# Patient Record
Sex: Female | Born: 1990 | Race: Black or African American | Hispanic: No | Marital: Single | Smoking: Current some day smoker
Health system: Southern US, Community
[De-identification: ages and names within clinical notes are randomized; demographics above are authoritative.]

## PROBLEM LIST (undated history)

## (undated) ENCOUNTER — Emergency Department (HOSPITAL_COMMUNITY): Admission: EM | Payer: BLUE CROSS/BLUE SHIELD | Source: Home / Self Care

## (undated) DIAGNOSIS — G43909 Migraine, unspecified, not intractable, without status migrainosus: Secondary | ICD-10-CM

## (undated) DIAGNOSIS — M549 Dorsalgia, unspecified: Secondary | ICD-10-CM

## (undated) HISTORY — DX: Migraine, unspecified, not intractable, without status migrainosus: G43.909

## (undated) HISTORY — PX: TONSILLECTOMY: SUR1361

---

## 2011-08-26 ENCOUNTER — Encounter (HOSPITAL_COMMUNITY): Payer: Self-pay

## 2011-08-26 ENCOUNTER — Emergency Department (INDEPENDENT_AMBULATORY_CARE_PROVIDER_SITE_OTHER)
Admission: EM | Admit: 2011-08-26 | Discharge: 2011-08-26 | Disposition: A | Discharge status: Home / Self Care | Payer: BC Managed Care – PPO | Source: Home / Self Care | Attending: Emergency Medicine | Admitting: Emergency Medicine

## 2011-08-26 DIAGNOSIS — M549 Dorsalgia, unspecified: Secondary | ICD-10-CM

## 2011-08-26 DIAGNOSIS — N390 Urinary tract infection, site not specified: Secondary | ICD-10-CM

## 2011-08-26 HISTORY — DX: Dorsalgia, unspecified: M54.9

## 2011-08-26 LAB — POCT URINALYSIS DIP (DEVICE)
Glucose, UA: NEGATIVE mg/dL
Ketones, ur: NEGATIVE mg/dL
Specific Gravity, Urine: 1.025 (ref 1.005–1.030)

## 2011-08-26 LAB — POCT PREGNANCY, URINE: Preg Test, Ur: NEGATIVE

## 2011-08-26 MED ORDER — SULFAMETHOXAZOLE-TRIMETHOPRIM 800-160 MG PO TABS: 1.0000 | ORAL_TABLET | Freq: Two times a day (BID) | ORAL | Status: AC

## 2011-08-26 MED ORDER — PREDNISONE 20 MG PO TABS: ORAL_TABLET | ORAL | Status: AC

## 2011-08-26 MED ORDER — HYDROCODONE-ACETAMINOPHEN 5-325 MG PO TABS: 2.0000 | ORAL_TABLET | ORAL | Status: AC | PRN

## 2011-08-26 MED ORDER — IBUPROFEN 600 MG PO TABS: 600.0000 mg | ORAL_TABLET | Freq: Four times a day (QID) | ORAL | Status: AC | PRN
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
## 2011-08-26 NOTE — ED Notes (Signed)
C/o pain in lower bilateral mid back, sometimes worse w movement, worse w CVJ percussion , denies UA syx, no blood in urine

## 2012-05-13 ENCOUNTER — Emergency Department (HOSPITAL_COMMUNITY)
Admission: EM | Admit: 2012-05-13 | Discharge: 2012-05-13 | Disposition: A | Discharge status: Home / Self Care | Payer: BC Managed Care – PPO | Attending: Emergency Medicine | Admitting: Emergency Medicine

## 2012-05-13 ENCOUNTER — Encounter (HOSPITAL_COMMUNITY): Payer: Self-pay | Admitting: Emergency Medicine

## 2012-05-13 DIAGNOSIS — Z87828 Personal history of other (healed) physical injury and trauma: Secondary | ICD-10-CM | POA: Insufficient documentation

## 2012-05-13 DIAGNOSIS — R112 Nausea with vomiting, unspecified: Secondary | ICD-10-CM | POA: Insufficient documentation

## 2012-05-13 LAB — PREGNANCY, URINE: Preg Test, Ur: NEGATIVE

## 2012-05-13 MED ORDER — ONDANSETRON 4 MG PO TBDP
8.0000 mg | ORAL_TABLET | Freq: Once | ORAL | Status: AC
  Administered 2012-05-13: 8 mg via ORAL
  Filled 2012-05-13: qty 2

## 2012-05-13 MED ORDER — ONDANSETRON 8 MG PO TBDP: 8.0000 mg | ORAL_TABLET | Freq: Three times a day (TID) | ORAL | Status: DC | PRN
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
## 2012-05-13 NOTE — ED Notes (Signed)
PT. REPORTS EMESIS WITH CHILLS ONSET LAST NIGHT , DENIES FEVER , NO VOMITTING OR DIARRHEA.

## 2014-10-17 ENCOUNTER — Other Ambulatory Visit: Payer: Self-pay | Admitting: Obstetrics & Gynecology

## 2014-10-17 ENCOUNTER — Other Ambulatory Visit (HOSPITAL_COMMUNITY)
Admission: RE | Admit: 2014-10-17 | Discharge: 2014-10-17 | Disposition: A | Discharge status: Home / Self Care | Payer: BLUE CROSS/BLUE SHIELD | Source: Ambulatory Visit | Attending: Obstetrics & Gynecology | Admitting: Obstetrics & Gynecology

## 2014-10-17 DIAGNOSIS — Z01419 Encounter for gynecological examination (general) (routine) without abnormal findings: Secondary | ICD-10-CM | POA: Insufficient documentation

## 2014-10-17 DIAGNOSIS — Z113 Encounter for screening for infections with a predominantly sexual mode of transmission: Secondary | ICD-10-CM | POA: Insufficient documentation

## 2014-10-18 LAB — CYTOLOGY - PAP

## 2015-03-21 ENCOUNTER — Emergency Department (INDEPENDENT_AMBULATORY_CARE_PROVIDER_SITE_OTHER)
Admission: EM | Admit: 2015-03-21 | Discharge: 2015-03-21 | Disposition: A | Discharge status: Home / Self Care | Payer: BLUE CROSS/BLUE SHIELD | Source: Home / Self Care | Attending: Family Medicine | Admitting: Family Medicine

## 2015-03-21 DIAGNOSIS — G43009 Migraine without aura, not intractable, without status migrainosus: Secondary | ICD-10-CM | POA: Diagnosis not present

## 2015-03-21 MED ORDER — SUMATRIPTAN SUCCINATE 100 MG PO TABS: 100.0000 mg | ORAL_TABLET | ORAL | Status: AC | PRN
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
## 2015-03-21 NOTE — Discharge Instructions (Signed)

## 2015-03-21 NOTE — ED Notes (Signed)
C/o headache States she has had a headache since 03/14/15 States does have family hx of migraines States zyrtec and tylenol used as tx

## 2015-03-27 ENCOUNTER — Emergency Department (HOSPITAL_COMMUNITY): Payer: BLUE CROSS/BLUE SHIELD

## 2015-03-27 ENCOUNTER — Emergency Department (HOSPITAL_COMMUNITY)
Admission: EM | Admit: 2015-03-27 | Discharge: 2015-03-28 | Disposition: A | Discharge status: Home / Self Care | Payer: BLUE CROSS/BLUE SHIELD | Attending: Emergency Medicine | Admitting: Emergency Medicine

## 2015-03-27 ENCOUNTER — Encounter (HOSPITAL_COMMUNITY): Payer: Self-pay | Admitting: *Deleted

## 2015-03-27 DIAGNOSIS — H539 Unspecified visual disturbance: Secondary | ICD-10-CM | POA: Diagnosis not present

## 2015-03-27 DIAGNOSIS — R51 Headache: Secondary | ICD-10-CM | POA: Diagnosis present

## 2015-03-27 DIAGNOSIS — H547 Unspecified visual loss: Secondary | ICD-10-CM

## 2015-03-27 LAB — CBC WITH DIFFERENTIAL/PLATELET
Basophils Absolute: 0 10*3/uL (ref 0.0–0.1)
Basophils Relative: 0 % (ref 0–1)
EOS PCT: 1 % (ref 0–5)
Eosinophils Absolute: 0.1 10*3/uL (ref 0.0–0.7)
HEMATOCRIT: 38.5 % (ref 36.0–46.0)
HEMOGLOBIN: 12.6 g/dL (ref 12.0–15.0)
LYMPHS ABS: 3.3 10*3/uL (ref 0.7–4.0)
LYMPHS PCT: 32 % (ref 12–46)
MCH: 29.9 pg (ref 26.0–34.0)
MCHC: 32.7 g/dL (ref 30.0–36.0)
MCV: 91.2 fL (ref 78.0–100.0)
Monocytes Absolute: 0.7 10*3/uL (ref 0.1–1.0)
Monocytes Relative: 6 % (ref 3–12)
NEUTROS ABS: 6.4 10*3/uL (ref 1.7–7.7)
NEUTROS PCT: 61 % (ref 43–77)
Platelets: 225 10*3/uL (ref 150–400)
RBC: 4.22 MIL/uL (ref 3.87–5.11)
RDW: 12.6 % (ref 11.5–15.5)
WBC: 10.4 10*3/uL (ref 4.0–10.5)

## 2015-03-27 LAB — SEDIMENTATION RATE: Sed Rate: 5 mm/hr (ref 0–22)

## 2015-03-27 LAB — BASIC METABOLIC PANEL
ANION GAP: 8 (ref 5–15)
BUN: 11 mg/dL (ref 6–20)
CHLORIDE: 101 mmol/L (ref 101–111)
CO2: 23 mmol/L (ref 22–32)
Calcium: 8.6 mg/dL — ABNORMAL LOW (ref 8.9–10.3)
Creatinine, Ser: 0.72 mg/dL (ref 0.44–1.00)
GFR calc Af Amer: >60 mL/min (ref >60)
GFR calc non Af Amer: >60 mL/min (ref >60)
GLUCOSE: 85 mg/dL (ref 65–99)
POTASSIUM: 4 mmol/L (ref 3.5–5.1)
Sodium: 132 mmol/L — ABNORMAL LOW (ref 135–145)

## 2015-03-27 MED ORDER — DIPHENHYDRAMINE HCL 50 MG/ML IJ SOLN
25.0000 mg | Freq: Once | INTRAMUSCULAR | Status: AC
  Administered 2015-03-27: 25 mg via INTRAVENOUS
  Filled 2015-03-27: qty 1

## 2015-03-27 MED ORDER — METOCLOPRAMIDE HCL 5 MG/ML IJ SOLN
10.0000 mg | Freq: Once | INTRAMUSCULAR | Status: AC
  Administered 2015-03-27: 10 mg via INTRAVENOUS
  Filled 2015-03-27: qty 2

## 2015-03-27 MED ORDER — IOHEXOL 350 MG/ML SOLN
80.0000 mL | Freq: Once | INTRAVENOUS | Status: AC | PRN
  Administered 2015-03-27: 80 mL via INTRAVENOUS

## 2015-03-27 MED ORDER — DEXAMETHASONE SODIUM PHOSPHATE 10 MG/ML IJ SOLN
10.0000 mg | Freq: Once | INTRAMUSCULAR | Status: AC
  Administered 2015-03-27: 10 mg via INTRAVENOUS
  Filled 2015-03-27: qty 1
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
## 2015-03-27 NOTE — ED Notes (Signed)
Pt reports two weeks ago started as headache, then began having pain behind her right eye and was seen at [facility] and told she had a migraine. Now has decreased vision in right eye since Tuesday, pcp sent her here for further eval. No neuro deficits noted at triage.

## 2015-03-28 ENCOUNTER — Other Ambulatory Visit: Payer: BLUE CROSS/BLUE SHIELD

## 2015-03-28 ENCOUNTER — Other Ambulatory Visit (HOSPITAL_COMMUNITY): Payer: Self-pay | Admitting: *Deleted

## 2015-03-28 ENCOUNTER — Encounter: Payer: Self-pay | Admitting: Neurology

## 2015-03-28 ENCOUNTER — Ambulatory Visit (INDEPENDENT_AMBULATORY_CARE_PROVIDER_SITE_OTHER): Payer: BLUE CROSS/BLUE SHIELD | Admitting: Neurology

## 2015-03-28 VITALS — BP 106/68 | HR 96 | Temp 98.2°F | Resp 16 | Ht 64.0 in | Wt 188.4 lb

## 2015-03-28 DIAGNOSIS — H469 Unspecified optic neuritis: Secondary | ICD-10-CM

## 2015-03-28 MED ORDER — SODIUM CHLORIDE 0.9 % IV SOLN: 1000.0000 mg | Freq: Every day | INTRAVENOUS | Status: DC

## 2015-03-28 MED ORDER — TETRACAINE HCL 0.5 % OP SOLN
2.0000 [drp] | Freq: Once | OPHTHALMIC | Status: AC
  Administered 2015-03-28: 2 [drp] via OPHTHALMIC
  Filled 2015-03-28: qty 2
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
## 2015-03-28 NOTE — Patient Instructions (Signed)
1. Bloodwork for ANA, ACE, pregnancy test (beta-HCG) 2. Schedule MRI brain and orbits with and without contrast, MRI cervical and thoracic spine with and without contrast 3. Start 3-day course of IV steroids tomorrow 4. Follow-up after MRI

## 2015-03-28 NOTE — Discharge Instructions (Signed)
PLEASE FOLLOW UP WITH DR. Vonna Kotyk AT 7:45AM TODAY 03/28/15 FOR YOUR FOLLOW UP APPOINTMENT. PLEASE DO NOT SKIP THIS APPOINTMENT. Please read all discharge instructions and return precautions.   Visual Disturbances You have had a disturbance in your vision. This may be caused by various conditions, such as:  Migraines. Migraine headaches are often preceded by a disturbance in vision. Blind spots or light flashes are followed by a headache. This type of visual disturbance is temporary. It does not damage the eye.  Glaucoma. This is caused by increased pressure in the eye. Symptoms include haziness, blurred vision, or seeing rainbow colored circles when looking at bright lights. Partial or complete visual loss can occur. You may or may not experience eye pain. Visual loss may be gradual or sudden and is irreversible. Glaucoma is the leading cause of blindness.  Retina problems. Vision will be reduced if the retina becomes detached or if there is a circulation problem as with diabetes, high blood pressure, or a mini-stroke. Symptoms include seeing "floaters," flashes of light, or shadows, as if a curtain has fallen over your eye.  Optic nerve problems. The main nerve in your eye can be damaged by redness, soreness, and swelling (inflammation), poor circulation, drugs, and toxins. It is very important to have a complete exam done by a specialist to determine the exact cause of your eye problem. The specialist may recommend medicines or surgery, depending on the cause of the problem. This can help prevent further loss of vision or reduce the risk of having a stroke. Contact the caregiver to whom you have been referred and arrange for follow-up care right away. SEEK IMMEDIATE MEDICAL CARE IF:   Your vision gets worse.  You develop severe headaches.  You have any weakness or numbness in the face, arms, or legs.  You have any trouble speaking or walking. Document Released: 08/08/2004 Document Revised:  09/23/2011 Document Reviewed: 11/29/2009 [facility] Patient Information 2015 [place], [place]. This information is not intended to replace advice given to you by your health care provider. Make sure you discuss any questions you have with your health care provider.

## 2015-03-28 NOTE — ED Notes (Signed)
Pt stable, ambulatory, states understanding of discharge instructions 

## 2015-03-29 ENCOUNTER — Encounter (HOSPITAL_COMMUNITY)
Admission: RE | Admit: 2015-03-29 | Discharge: 2015-03-29 | Disposition: A | Discharge status: Home / Self Care | Payer: BLUE CROSS/BLUE SHIELD | Source: Ambulatory Visit | Attending: Neurology | Admitting: Neurology

## 2015-03-29 DIAGNOSIS — H469 Unspecified optic neuritis: Secondary | ICD-10-CM | POA: Diagnosis not present

## 2015-03-29 LAB — ANGIOTENSIN CONVERTING ENZYME: ANGIOTENSIN-CONVERTING ENZYME: 41 U/L (ref 8–52)

## 2015-03-29 LAB — ANCA TITERS: C-ANCA: <1:20 {titer}

## 2015-03-29 LAB — HCG, SERUM, QUALITATIVE: PREG SERUM: NEGATIVE

## 2015-03-29 LAB — ANA: Anti Nuclear Antibody(ANA): NEGATIVE

## 2015-03-29 MED ORDER — SODIUM CHLORIDE 0.9 % IV SOLN
1000.0000 mg | Freq: Every day | INTRAVENOUS | Status: DC
  Administered 2015-03-29: 1000 mg via INTRAVENOUS
  Filled 2015-03-29: qty 8

## 2015-03-30 ENCOUNTER — Encounter (HOSPITAL_COMMUNITY)
Admission: RE | Admit: 2015-03-30 | Discharge: 2015-03-30 | Disposition: A | Discharge status: Home / Self Care | Payer: BLUE CROSS/BLUE SHIELD | Source: Ambulatory Visit | Attending: Neurology | Admitting: Neurology

## 2015-03-30 ENCOUNTER — Telehealth: Payer: Self-pay | Admitting: Family Medicine

## 2015-03-30 DIAGNOSIS — H469 Unspecified optic neuritis: Secondary | ICD-10-CM | POA: Insufficient documentation

## 2015-03-30 MED ORDER — METHYLPREDNISOLONE SODIUM SUCC 1000 MG IJ SOLR
1000.0000 mg | Freq: Every day | INTRAMUSCULAR | Status: DC
  Administered 2015-03-30: 1000 mg via INTRAVENOUS
  Filled 2015-03-30: qty 8

## 2015-03-30 NOTE — Progress Notes (Signed)
pts has requested to keep IV in her left hand since she has to be back tomorrow for another infusion.  IV is flushing well and there was blood return.  IV was saline locked and flushed and wrapped with hypofix

## 2015-03-30 NOTE — Telephone Encounter (Signed)
Patient was notified of results.  

## 2015-03-30 NOTE — Telephone Encounter (Signed)
-----   Message from Van Clines, MD sent at 03/29/2015  2:31 PM EDT ----- Pls let her know bloodwork did not show any inflammation, pregnancy test negative. Thanks

## 2015-03-31 ENCOUNTER — Encounter (HOSPITAL_COMMUNITY)
Admission: RE | Admit: 2015-03-31 | Discharge: 2015-03-31 | Disposition: A | Discharge status: Home / Self Care | Payer: BLUE CROSS/BLUE SHIELD | Source: Ambulatory Visit | Attending: Neurology | Admitting: Neurology

## 2015-03-31 ENCOUNTER — Telehealth: Payer: Self-pay | Admitting: Neurology

## 2015-03-31 DIAGNOSIS — H469 Unspecified optic neuritis: Secondary | ICD-10-CM | POA: Insufficient documentation

## 2015-03-31 MED ORDER — METHYLPREDNISOLONE SODIUM SUCC 1000 MG IJ SOLR
1000.0000 mg | Freq: Every day | INTRAMUSCULAR | Status: DC
  Administered 2015-03-31: 1000 mg via INTRAVENOUS
  Filled 2015-03-31: qty 8

## 2015-03-31 NOTE — Telephone Encounter (Signed)
Pt called requesting an appt for/ IV Steroid Tx//call back @ [phone]

## 2015-03-31 NOTE — Telephone Encounter (Signed)
Lmovm to return my call. 

## 2015-03-31 NOTE — Telephone Encounter (Signed)
I spoke with her. She wanted to ask if you thought she would benefit from 2 more days of IV solu medrol. She states it's something that her mother mentioned to her.

## 2015-03-31 NOTE — Telephone Encounter (Signed)
Pls let her know that the Optic Neuritis Treatment Trial where a large number of patients with optic neuritis were studied, indicated 3 days of IV steroids, this is what our plan is based on. The 5-days is if dose is 500mg , but her dose is higher 1000mg . Pls have her let mother know this information. Thanks.

## 2015-03-31 NOTE — Telephone Encounter (Signed)
Called patient and notified of below information.

## 2015-04-04 ENCOUNTER — Ambulatory Visit (HOSPITAL_COMMUNITY): Admission: RE | Admit: 2015-04-04 | Payer: BLUE CROSS/BLUE SHIELD | Source: Ambulatory Visit

## 2015-04-04 ENCOUNTER — Other Ambulatory Visit: Payer: Self-pay | Admitting: Neurology

## 2015-04-04 ENCOUNTER — Ambulatory Visit (HOSPITAL_COMMUNITY)
Admission: RE | Admit: 2015-04-04 | Discharge: 2015-04-04 | Disposition: A | Discharge status: Home / Self Care | Payer: BLUE CROSS/BLUE SHIELD | Source: Ambulatory Visit | Attending: Neurology | Admitting: Neurology

## 2015-04-04 DIAGNOSIS — G35 Multiple sclerosis: Secondary | ICD-10-CM | POA: Diagnosis not present

## 2015-04-04 DIAGNOSIS — H469 Unspecified optic neuritis: Secondary | ICD-10-CM

## 2015-04-04 DIAGNOSIS — G379 Demyelinating disease of central nervous system, unspecified: Secondary | ICD-10-CM | POA: Diagnosis not present

## 2015-04-04 MED ORDER — GADOBENATE DIMEGLUMINE 529 MG/ML IV SOLN
18.0000 mL | Freq: Once | INTRAVENOUS | Status: AC | PRN
  Administered 2015-04-04: 18 mL via INTRAVENOUS

## 2015-04-05 ENCOUNTER — Telehealth: Payer: Self-pay | Admitting: *Deleted

## 2015-04-05 NOTE — Telephone Encounter (Signed)
Called patient and asked her to call me back about setting up an appointment for Friday or Tuesday.

## 2015-04-05 NOTE — Telephone Encounter (Signed)
Patient called back and is not sure which day she can come in.  She will call back to let us know.

## 2015-04-05 NOTE — Telephone Encounter (Signed)
-----   Message from Van Clines, MD sent at 04/05/2015  2:14 PM EDT ----- Regarding: clinic follow-up Can you pls ask patient when cervical and thoracic spine is scheduled? Would like to discuss results with her, I have openings on Friday and Tues, thanks

## 2015-04-05 NOTE — Telephone Encounter (Signed)
-----   Message from Karen M Aquino, MD sent at 04/05/2015  2:14 PM EDT ----- Regarding: clinic follow-up Can you pls ask patient when cervical and thoracic spine is scheduled? Would like to discuss results with her, I have openings on Friday and Tues, thanks 

## 2015-04-06 ENCOUNTER — Ambulatory Visit (HOSPITAL_COMMUNITY): Payer: BLUE CROSS/BLUE SHIELD

## 2015-04-06 ENCOUNTER — Ambulatory Visit (HOSPITAL_COMMUNITY)
Admission: RE | Admit: 2015-04-06 | Discharge: 2015-04-06 | Disposition: A | Discharge status: Home / Self Care | Payer: BLUE CROSS/BLUE SHIELD | Source: Ambulatory Visit | Attending: Neurology | Admitting: Neurology

## 2015-04-06 DIAGNOSIS — H469 Unspecified optic neuritis: Secondary | ICD-10-CM | POA: Diagnosis not present

## 2015-04-06 MED ORDER — GADOBENATE DIMEGLUMINE 529 MG/ML IV SOLN
20.0000 mL | Freq: Once | INTRAVENOUS | Status: AC | PRN
  Administered 2015-04-06: 18 mL via INTRAVENOUS

## 2015-04-07 ENCOUNTER — Encounter: Payer: Self-pay | Admitting: Neurology

## 2015-04-07 ENCOUNTER — Ambulatory Visit (INDEPENDENT_AMBULATORY_CARE_PROVIDER_SITE_OTHER): Payer: BLUE CROSS/BLUE SHIELD | Admitting: Neurology

## 2015-04-07 VITALS — BP 110/70 | HR 82 | Resp 16 | Ht 64.0 in | Wt 189.0 lb

## 2015-04-07 DIAGNOSIS — H469 Unspecified optic neuritis: Secondary | ICD-10-CM

## 2015-04-07 DIAGNOSIS — G35 Multiple sclerosis: Secondary | ICD-10-CM | POA: Diagnosis not present

## 2015-04-07 NOTE — Patient Instructions (Signed)
1. Schedule lumbar puncture under fluoroscopy 2. Read about the medications, let me know your preference (Gilenya or Tecfidera) 3. Call our office for any questions 4. Follow-up in 1-2 weeks (after spinal tap)
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
## 2015-04-10 ENCOUNTER — Encounter: Payer: Self-pay | Admitting: Neurology

## 2015-04-10 ENCOUNTER — Other Ambulatory Visit: Payer: Self-pay | Admitting: Family Medicine

## 2015-04-10 DIAGNOSIS — G35 Multiple sclerosis: Secondary | ICD-10-CM

## 2015-04-10 DIAGNOSIS — H469 Unspecified optic neuritis: Secondary | ICD-10-CM

## 2015-04-14 ENCOUNTER — Other Ambulatory Visit: Payer: Self-pay | Admitting: Neurology

## 2015-04-14 ENCOUNTER — Ambulatory Visit
Admission: RE | Admit: 2015-04-14 | Discharge: 2015-04-14 | Disposition: A | Discharge status: Home / Self Care | Payer: BLUE CROSS/BLUE SHIELD | Source: Ambulatory Visit | Attending: Neurology | Admitting: Neurology

## 2015-04-14 DIAGNOSIS — G35 Multiple sclerosis: Secondary | ICD-10-CM

## 2015-04-14 DIAGNOSIS — H469 Unspecified optic neuritis: Secondary | ICD-10-CM

## 2015-04-14 LAB — CSF CELL COUNT WITH DIFFERENTIAL
EOS CSF: 0 % (ref 0–1)
LYMPHS CSF: 92 % — AB (ref 40–80)
MONOCYTE/MACROPHAGE: 8 % — AB (ref 15–45)
RBC Count, CSF: 0 cu mm
SEGMENTED NEUTROPHILS-CSF: 0 % (ref 0–6)
Tube #: 3
WBC CSF: 20 uL — AB (ref 0–5)

## 2015-04-14 LAB — GLUCOSE, CSF: Glucose, CSF: 54 mg/dL (ref 43–76)

## 2015-04-14 LAB — PROTEIN, CSF: Total Protein, CSF: 42 mg/dL (ref 15–45)

## 2015-04-14 NOTE — Discharge Instructions (Signed)

## 2015-04-14 NOTE — Progress Notes (Signed)
2 SST tubes drawn from right AC. Site is unremarkable and pt tolerated procedure well.  Discharge instructions explained to pt.

## 2015-04-18 LAB — CSF CULTURE W GRAM STAIN
Gram Stain: NONE SEEN
Organism ID, Bacteria: NO GROWTH

## 2015-04-18 LAB — CRYPTOCOCCAL AG, LTX SCR RFLX TITER: Cryptococcal Ag Screen: NOT DETECTED

## 2015-04-18 LAB — ANGIOTENSIN CONVERTING ENZYME, CSF: ACE, CSF: 7 U/L (ref <=15)

## 2015-04-19 ENCOUNTER — Encounter: Payer: Self-pay | Admitting: Neurology

## 2015-04-19 ENCOUNTER — Ambulatory Visit (INDEPENDENT_AMBULATORY_CARE_PROVIDER_SITE_OTHER): Payer: BLUE CROSS/BLUE SHIELD | Admitting: Neurology

## 2015-04-19 ENCOUNTER — Telehealth: Payer: Self-pay | Admitting: Neurology

## 2015-04-19 VITALS — BP 118/70 | HR 96 | Resp 16 | Ht 64.0 in | Wt 188.0 lb

## 2015-04-19 DIAGNOSIS — G35 Multiple sclerosis: Secondary | ICD-10-CM | POA: Diagnosis not present

## 2015-04-19 DIAGNOSIS — M545 Low back pain: Secondary | ICD-10-CM | POA: Diagnosis not present

## 2015-04-19 DIAGNOSIS — H469 Unspecified optic neuritis: Secondary | ICD-10-CM | POA: Diagnosis not present

## 2015-04-19 LAB — NEUROMYELITIS OPTICA AQP4 AUTO AB

## 2015-04-19 MED ORDER — METAXALONE 400 MG PO TABS: ORAL_TABLET | ORAL | Status: AC

## 2015-04-19 MED ORDER — NAPROXEN 500 MG PO TABS: ORAL_TABLET | ORAL | Status: AC

## 2015-04-19 NOTE — Telephone Encounter (Signed)
Robin from [facility] returned a call/ stated Test Code [place] 35080/ for pt/ Heidi Jimenez//call back @ [phone] for more info

## 2015-04-19 NOTE — Patient Instructions (Addendum)
1. Bloodwork for CBC with differential, CMP, vitamin D level, JC virus DNA PCR (whole blood), HIV 2. We will start the process of getting Tecfidera approved 3. Follow-up with eye doctor as scheduled 4. Follow-up in 3 months, call our office for any changes in symptoms
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
## 2015-04-20 LAB — MYELIN BASIC PROTEIN, CSF: Myelin Basic Protein: <2 mcg/L (ref 0.0–4.0)

## 2015-04-20 LAB — OLIGOCLONAL BANDS, CSF + SERM

## 2015-04-26 ENCOUNTER — Ambulatory Visit: Payer: BLUE CROSS/BLUE SHIELD | Admitting: Neurology

## 2015-05-05 ENCOUNTER — Telehealth: Payer: Self-pay | Admitting: Family Medicine

## 2015-05-05 NOTE — Telephone Encounter (Signed)
Called patient to check with her about finalizing her Rx for Tecfidera. Prior Auth was sent to her insurance co this week for approval. I received faxed approval from [facility] on yesterday. I called [employer] this morning to see what the next step was after approval. They were going to call the patient to get her set up with [facility] to process Rx and to get financial information from her.     When I spoke with her this afternoon she says that she did speak with the rep and everything has been processed with the specialty pharmacy and she is supposed to receive the medication tomorrow.

## 2015-05-10 ENCOUNTER — Emergency Department (HOSPITAL_COMMUNITY)
Admission: EM | Admit: 2015-05-10 | Discharge: 2015-05-11 | Disposition: A | Discharge status: Home / Self Care | Payer: BLUE CROSS/BLUE SHIELD | Attending: Emergency Medicine | Admitting: Emergency Medicine

## 2015-05-10 ENCOUNTER — Encounter (HOSPITAL_COMMUNITY): Payer: Self-pay | Admitting: Emergency Medicine

## 2015-05-10 DIAGNOSIS — S60042A Contusion of left ring finger without damage to nail, initial encounter: Secondary | ICD-10-CM | POA: Diagnosis not present

## 2015-05-10 DIAGNOSIS — Y9289 Other specified places as the place of occurrence of the external cause: Secondary | ICD-10-CM | POA: Diagnosis not present

## 2015-05-10 DIAGNOSIS — Y9367 Activity, basketball: Secondary | ICD-10-CM | POA: Insufficient documentation

## 2015-05-10 DIAGNOSIS — Z720 Tobacco use: Secondary | ICD-10-CM | POA: Insufficient documentation

## 2015-05-10 DIAGNOSIS — G43909 Migraine, unspecified, not intractable, without status migrainosus: Secondary | ICD-10-CM | POA: Diagnosis not present

## 2015-05-10 DIAGNOSIS — W500XXA Accidental hit or strike by another person, initial encounter: Secondary | ICD-10-CM | POA: Diagnosis not present

## 2015-05-10 DIAGNOSIS — S6992XA Unspecified injury of left wrist, hand and finger(s), initial encounter: Secondary | ICD-10-CM

## 2015-05-10 DIAGNOSIS — Y998 Other external cause status: Secondary | ICD-10-CM | POA: Insufficient documentation

## 2015-05-10 DIAGNOSIS — S60032A Contusion of left middle finger without damage to nail, initial encounter: Secondary | ICD-10-CM | POA: Insufficient documentation

## 2015-05-10 NOTE — ED Notes (Signed)
Pt states she was playing basketball and fell on her left hand  Pt has bruising and swelling noted to the palm area of her hand and states she cannot move her 4th ring finger as much as the others
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
## 2015-05-11 ENCOUNTER — Emergency Department (HOSPITAL_COMMUNITY): Payer: BLUE CROSS/BLUE SHIELD

## 2015-05-11 MED ORDER — MELOXICAM 15 MG PO TABS: 15.0000 mg | ORAL_TABLET | Freq: Every day | ORAL | Status: AC

## 2015-05-11 NOTE — Discharge Instructions (Signed)
Take Mobic as needed for pain. Refer to attached documents for more information.

## 2015-05-16 ENCOUNTER — Telehealth: Payer: Self-pay | Admitting: Neurology

## 2015-05-16 ENCOUNTER — Telehealth: Payer: Self-pay | Admitting: Family Medicine

## 2015-05-16 MED ORDER — VITAMIN D (ERGOCALCIFEROL) 1.25 MG (50000 UNIT) PO CAPS: ORAL_CAPSULE | ORAL | Status: AC

## 2015-05-16 NOTE — Telephone Encounter (Signed)
-----   Message from Van Clines, MD sent at 05/16/2015  8:54 AM EDT ----- Regarding: bloodwork Pls let her know some of bloodwork took time to come back, but all looks good except her vitamin D level is low. Would start vitamin D 50,000 IU once a week for 8 weeks, then after start daily calcium 500mg  and vitamin D 600 IU once a day. Thanks

## 2015-05-16 NOTE — Telephone Encounter (Signed)
Labwork from [facility]:  CBC: WBC 9.5 Hgb 12.9 Hct 39.8 Plt 268 Neutrophils 68 Lymphs 25 Monocytes 6 Eos 1 Neutrophils (Absolute) 6.3 Lymphs (Absolute) 2.4  CMP normal  Vitamin D 12.8 (low)  HIV nonreactive  JC virus negative

## 2015-05-16 NOTE — Telephone Encounter (Signed)
Patient was notified of results and advisement on Vitamin D. Rx for Vitamin D 50,000 IU will be sent to patient's pharmacy. She will start otc vitamin D & calcium once she is finished with Rx.

## 2015-06-21 ENCOUNTER — Telehealth: Payer: Self-pay | Admitting: Neurology

## 2015-06-21 NOTE — Telephone Encounter (Signed)
PT called and said she just found out she was pregnant and was asking about taking her medication Tecfidera/Dawn CB# [phone]

## 2015-06-21 NOTE — Telephone Encounter (Signed)
Please advise 

## 2015-06-23 NOTE — Telephone Encounter (Signed)
Instructed patient to stop Tecfidera. There is lower relapse rate during pregnancy, however increases again the first 3 months post-partum, so continue to monitor for now. She knows to call for any change in symptoms. She feels her vision is getting better and better, mostly she has red color desaturation, no eye pain.

## 2015-07-25 ENCOUNTER — Ambulatory Visit: Payer: BLUE CROSS/BLUE SHIELD | Admitting: Neurology

## 2015-12-07 ENCOUNTER — Telehealth: Payer: Self-pay | Admitting: Neurology

## 2015-12-07 NOTE — Telephone Encounter (Signed)
Patient was on Tecfidera. Wants to know when she can start back on med & if once she starts back on med will if be safe for her to breast feed?

## 2015-12-07 NOTE — Telephone Encounter (Signed)
Recommend staying off the medication while pregnancy and while breastfeeding.  Unfortunately, no studies have been done to determine the safety of Tecfidera while breastfeeding and whether it passes through breast milk.

## 2015-12-07 NOTE — Telephone Encounter (Signed)
19-Oct-1990 Heidi Jimenez. She is a patient of Dr. Karel Jarvis. She has MS and she has stopped her MS medication when she became pregnant. She is about 8 months now. She was wondering about when she could start it back. She also had a question about breast feeding. Her number is [phone]. Thank you

## 2015-12-07 NOTE — Telephone Encounter (Signed)
Left msg to rtn my call. 

## 2015-12-08 NOTE — Telephone Encounter (Signed)
Patient returned my call. Notified her of advisement. She is going to make an appt to comeback in and see Dr. Karel Jarvis once she has the baby to discuss what if any other treatment alternatives she has while she will be breastfeeding.

## 2016-02-13 ENCOUNTER — Inpatient Hospital Stay (HOSPITAL_COMMUNITY)
Admission: AD | Admit: 2016-02-13 | Payer: BLUE CROSS/BLUE SHIELD | Source: Ambulatory Visit | Admitting: Obstetrics & Gynecology

## 2016-06-14 IMAGING — MR MR THORACIC SPINE WO/W CM
6 of 15 series · 19 of 48 positions shown · IV contrast (multihance)
Comparison: None.

CLINICAL DATA: Optic neuritis.

EXAM:
MRI CERVICAL AND THORACIC SPINE WITHOUT AND WITH CONTRAST
TECHNIQUE: Multiplanar and multiecho pulse sequences of the cervical spine, to
include the craniocervical junction and cervicothoracic junction,
and thoracic spine, were obtained without and with intravenous
contrast.
CONTRAST:  18mL MULTIHANCE GADOBENATE DIMEGLUMINE 529 MG/ML IV SOLN

[Series 2: T1 · sagittal · 3.0mm · 0.41mm/px · 2 of 12 slices shown (1 of 2)]
[im 1/12]
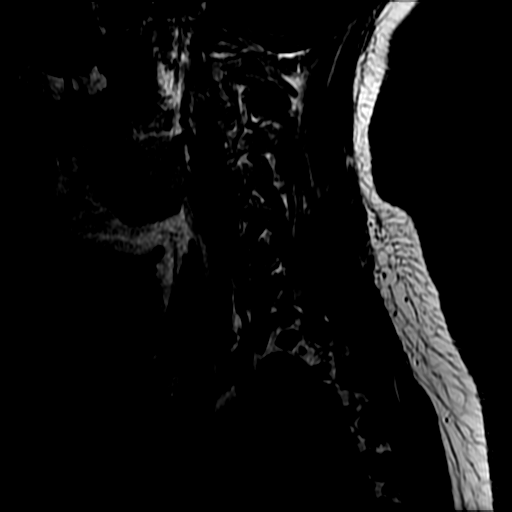
[im 12/12]
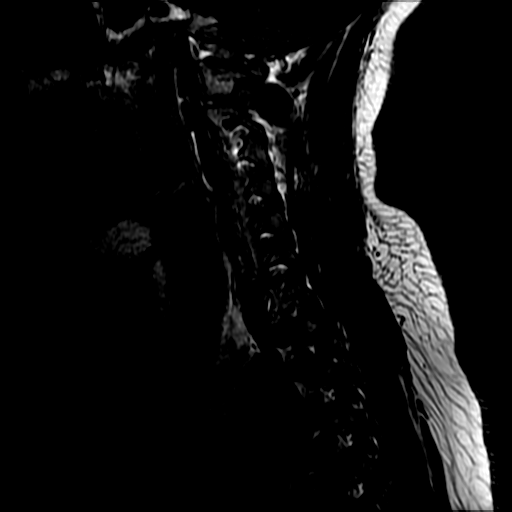

[Series 4: T2 · axial · 3.0mm · 0.35mm/px · z∈[-47,+46]mm · 4 of 29 slices shown (1 of 2)]
[im 1/29]
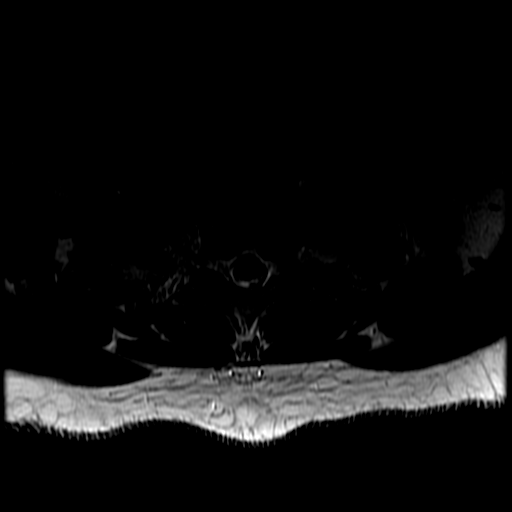
[im 10/29]
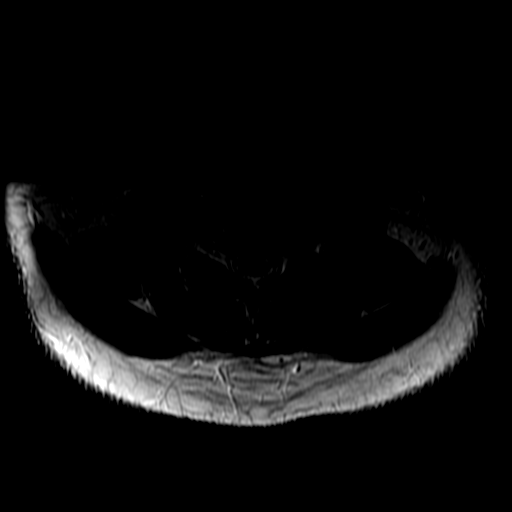
[im 19/29]
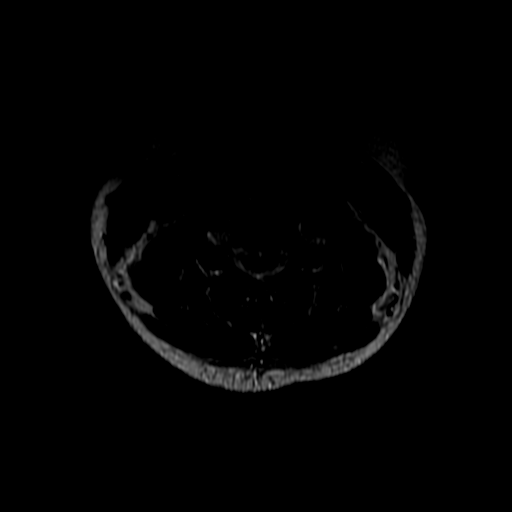
[im 29/29]
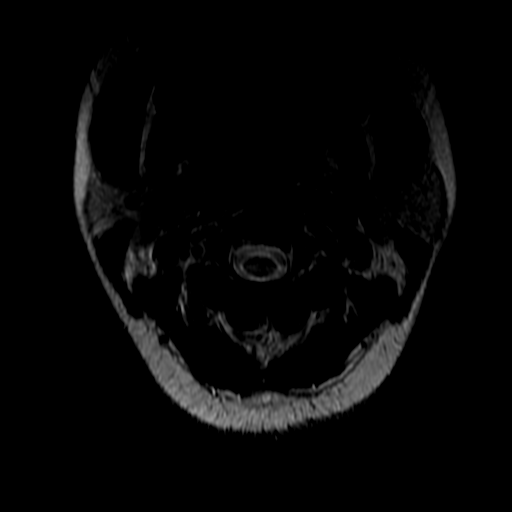

[Series 5: T1 · axial · non-contrast · 3.0mm · 0.35mm/px · z∈[-47,+13]mm · 3 of 29 slices shown (2 of 2)]
[im 1/29]
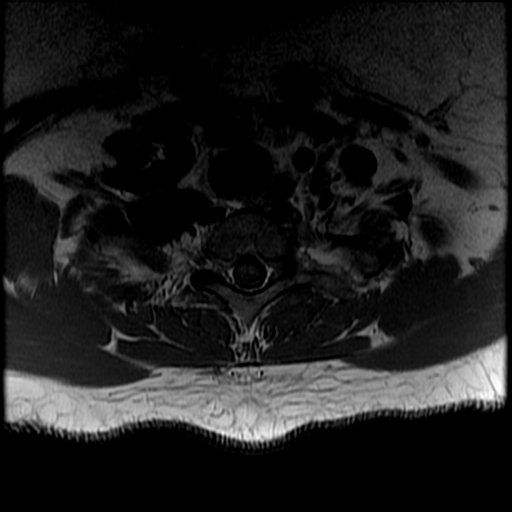
[im 10/29]
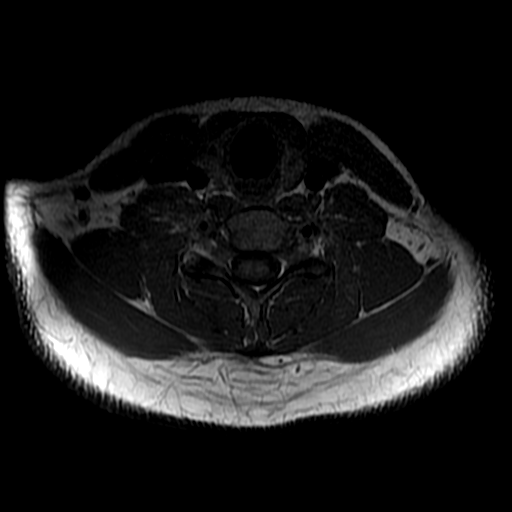
[im 19/29]
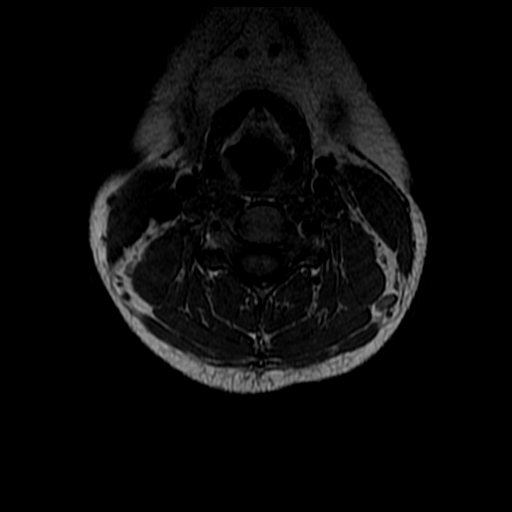

[Series 12: T2 · axial · 4.0mm · 0.39mm/px · z∈[-221,-16]mm · 6 of 39 slices shown (2 of 2)]
[im 1/39]
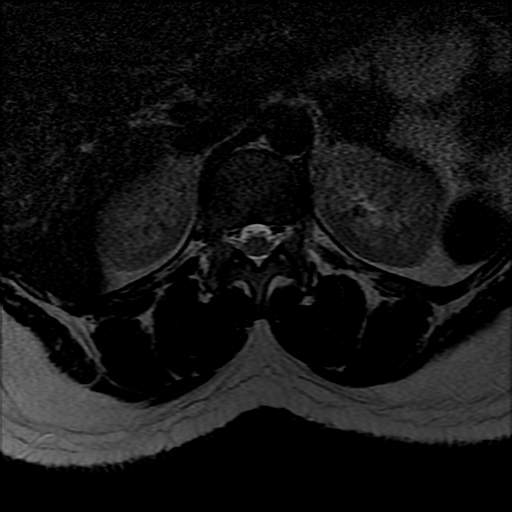
[im 8/39]
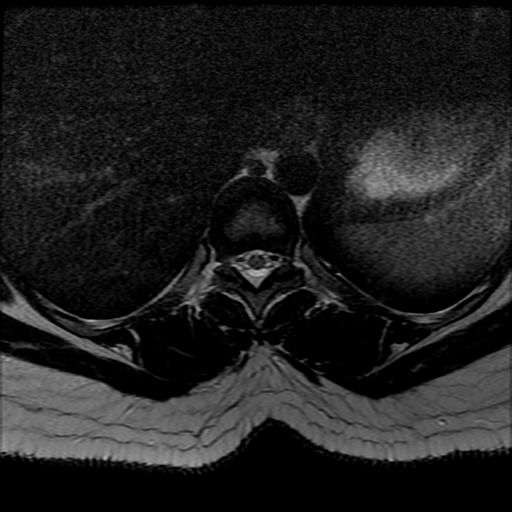
[im 16/39]
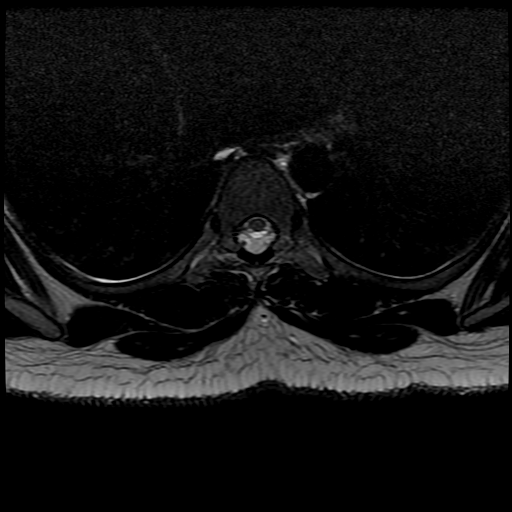
[im 23/39]
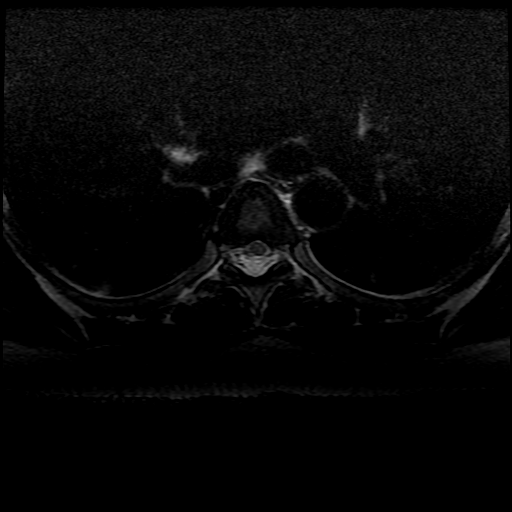
[im 31/39]
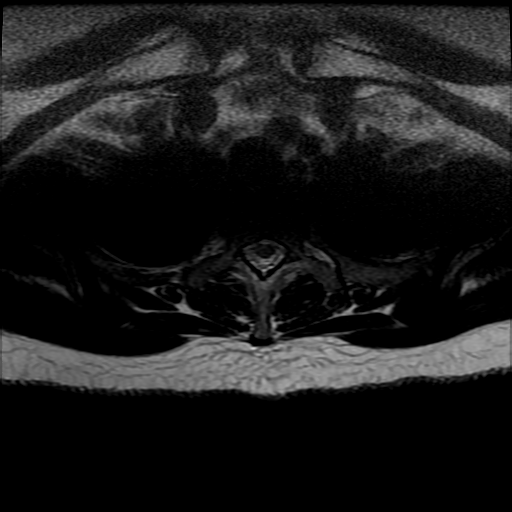
[im 39/39]
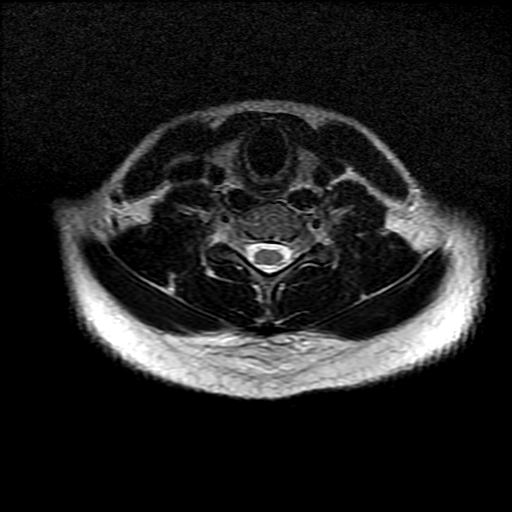

[Series 14: T2 post-contrast · sagittal · 3.0mm · 0.62mm/px · 2 of 14 slices shown (1 of 2)]
[im 1/14]
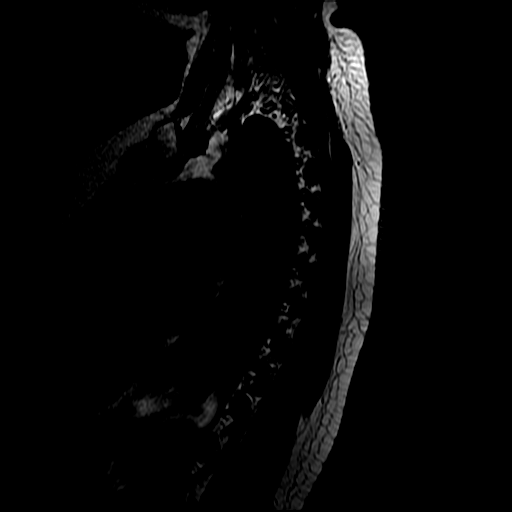
[im 14/14]
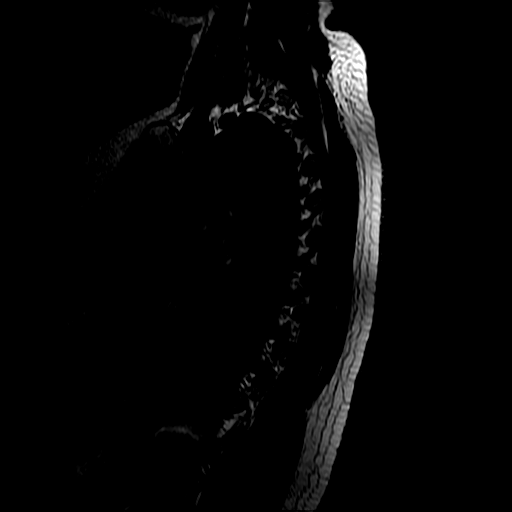

[Series 17: T2 post-contrast · sagittal · 3.0mm · 0.41mm/px · 2 of 12 slices shown (2 of 2)]
[im 1/12]
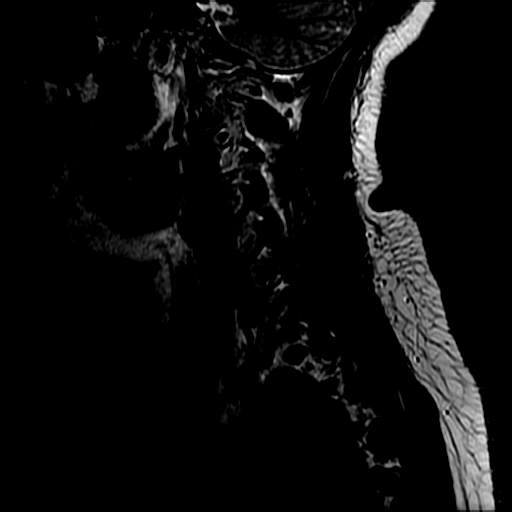
[im 12/12]
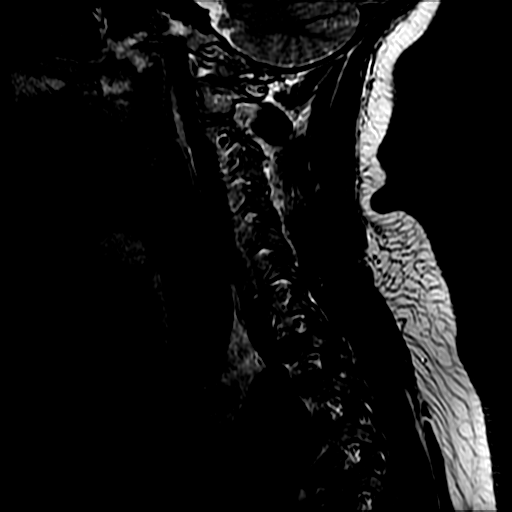

[19 of 48 positions shown; findings below may reference images not displayed]

FINDINGS: MRI CERVICAL SPINE FINDINGS

There is straightening of the normal cervical lordosis. There is no
listhesis. Vertebral body heights and intervertebral disc space
heights are preserved. Vertebral bone marrow signal is normal.
Craniocervical junction is unremarkable. No cervical disc herniation
or stenosis is seen.

There are patchy T2 hyperintense lesions in the mid and upper
cervical spinal cord. The most prominent lesion is located at C4-5
in the dorsal left lateral aspect of the cord, measuring
approximately 11 mm in craniocaudal length. Lesions are also present
in the right cord at C2 and right cord at C3-4. No abnormal
enhancement is identified. The spinal cord is normal in caliber.

MRI THORACIC SPINE FINDINGS

Vertebral alignment is normal. Vertebral body heights and
intervertebral disc space heights are preserved. Vertebral bone
marrow signal is normal.

The thoracic spinal cord is normal in caliber and signal. No
abnormal enhancement is identified. No thoracic disc herniation or
stenosis is identified. Paraspinal soft tissues are unremarkable.
IMPRESSION: 1. Three cervical spinal cord lesions consistent with demyelinating
disease/multiple sclerosis. No abnormal enhancement to suggest
active demyelination.
2. Normal appearance of the thoracic spinal cord.
